# Patient Record
Sex: Male | Born: 1965 | Race: White | Hispanic: No | Marital: Married
Health system: Southern US, Community
[De-identification: ages and names within clinical notes are randomized; demographics above are authoritative.]

---

## 2016-01-17 ENCOUNTER — Emergency Department: Payer: Self-pay

## 2016-01-17 ENCOUNTER — Emergency Department
Admission: EM | Admit: 2016-01-17 | Discharge: 2016-01-17 | Disposition: A | Discharge status: Home / Self Care | Payer: Self-pay | Attending: Emergency Medicine | Admitting: Emergency Medicine

## 2016-01-17 ENCOUNTER — Encounter: Payer: Self-pay | Admitting: Emergency Medicine

## 2016-01-17 DIAGNOSIS — K824 Cholesterolosis of gallbladder: Secondary | ICD-10-CM | POA: Insufficient documentation

## 2016-01-17 DIAGNOSIS — K279 Peptic ulcer, site unspecified, unspecified as acute or chronic, without hemorrhage or perforation: Secondary | ICD-10-CM | POA: Insufficient documentation

## 2016-01-17 DIAGNOSIS — R109 Unspecified abdominal pain: Secondary | ICD-10-CM

## 2016-01-17 LAB — TROPONIN I

## 2016-01-17 LAB — COMPREHENSIVE METABOLIC PANEL
ALK PHOS: 104 U/L (ref 38–126)
ALT: 25 U/L (ref 17–63)
ANION GAP: 7 (ref 5–15)
AST: 28 U/L (ref 15–41)
Albumin: 4.4 g/dL (ref 3.5–5.0)
BILIRUBIN TOTAL: 0.8 mg/dL (ref 0.3–1.2)
BUN: 11 mg/dL (ref 6–20)
CALCIUM: 9.2 mg/dL (ref 8.9–10.3)
CO2: 25 mmol/L (ref 22–32)
Chloride: 104 mmol/L (ref 101–111)
Creatinine, Ser: 0.87 mg/dL (ref 0.61–1.24)
Glucose, Bld: 107 mg/dL — ABNORMAL HIGH (ref 65–99)
Potassium: 4.1 mmol/L (ref 3.5–5.1)
SODIUM: 136 mmol/L (ref 135–145)
TOTAL PROTEIN: 6.9 g/dL (ref 6.5–8.1)

## 2016-01-17 LAB — CBC
HCT: 45.5 % (ref 40.0–52.0)
Hemoglobin: 15.6 g/dL (ref 13.0–18.0)
MCH: 31.4 pg (ref 26.0–34.0)
MCHC: 34.2 g/dL (ref 32.0–36.0)
MCV: 91.9 fL (ref 80.0–100.0)
PLATELETS: 230 10*3/uL (ref 150–440)
RBC: 4.95 MIL/uL (ref 4.40–5.90)
RDW: 13.5 % (ref 11.5–14.5)
WBC: 6.3 10*3/uL (ref 3.8–10.6)

## 2016-01-17 LAB — LIPASE, BLOOD: Lipase: 47 U/L (ref 11–51)

## 2016-01-17 MED ORDER — GI COCKTAIL ~~LOC~~
30.0000 mL | Freq: Once | ORAL | Status: AC
  Administered 2016-01-17: 30 mL via ORAL
  Filled 2016-01-17: qty 30

## 2016-01-17 MED ORDER — SUCRALFATE 1 G PO TABS: 1.0000 g | ORAL_TABLET | Freq: Three times a day (TID) | ORAL | Status: AC

## 2016-01-17 NOTE — ED Notes (Signed)
Reviewed d/c instructions, follow-up care, diet, and prescriptions. Pt verbalized understanding

## 2016-01-17 NOTE — Discharge Instructions (Signed)

## 2016-01-17 NOTE — ED Notes (Signed)
Epigastric pain worsening when patient: "ate a taco salad"

## 2016-01-17 NOTE — ED Provider Notes (Addendum)
[facility] Emergency Department Provider Note  ____________________________________________   I have reviewed the triage vital signs and the nursing notes.   HISTORY  Chief Complaint Abdominal Pain    HPI Frank Michael is a 50 y.o. male with a history of acid reflux and states for months he has been having epigastric abdominal pain.Longer than that actually he states but usually was well-controlled when he took antacids. However he stopped taking antacids about a month ago and his pain is gotten worse. He finally had some very spicy food on Wednesday and that seemed to "kick things off" and since that time he said off and on epigastric abdominal pain which is worse with food. No radiation. No melena bright red blood per rectum. Patient smokes but does not use alcohol. Denies any nausea or vomiting or fever or chills or any other associated symptoms. Nothing makes the pain better except for avoiding spicy food nothing makes it worse aside from spicy food. He states he did have some minimal relief with his Nexium which he finally started taking again yesterday. No chest pain or shortness of breath no PE symptoms no DVT symptoms  History reviewed. No pertinent past medical history.  There are no active problems to display for this patient.   History reviewed. No pertinent past surgical history.  No current outpatient prescriptions on file.  Allergies Review of patient's allergies indicates no known allergies.  History reviewed. No pertinent family history.  Social History Social History  Substance Use Topics  . Smoking status: Unknown If Ever Smoked  . Smokeless tobacco: None  . Alcohol Use: None    Review of Systems Constitutional: No fever/chills Eyes: No visual changes. ENT: No sore throat. No stiff neck no neck pain Cardiovascular: Denies chest pain. Respiratory: Denies shortness of breath. Gastrointestinal:   no vomiting.  No diarrhea.  No  constipation. Genitourinary: Negative for dysuria. Musculoskeletal: Negative lower extremity swelling Skin: Negative for rash. Neurological: Negative for headaches, focal weakness or numbness. 10-point ROS otherwise negative.  ____________________________________________   PHYSICAL EXAM:  VITAL SIGNS: ED Triage Vitals  Enc Vitals Group     BP 01/17/16 1138 117/70 mmHg     Pulse Rate 01/17/16 1138 74     Resp 01/17/16 1138 18     Temp 01/17/16 1138 98.4 F (36.9 C)     Temp Source 01/17/16 1138 Oral     SpO2 01/17/16 1138 98 %     Weight 01/17/16 1138 130 lb (58.968 kg)     Height 01/17/16 1138 5\' 6"  (1.676 m)     Head Cir --      Peak Flow --      Pain Score 01/17/16 1138 5     Pain Loc --      Pain Edu? --      Excl. in GC? --     Constitutional: Alert and oriented. Well appearing and in no acute distress. Eyes: Conjunctivae are normal. PERRL. EOMI. Head: Atraumatic. Nose: No congestion/rhinnorhea. Mouth/Throat: Mucous membranes are moist.  Oropharynx non-erythematous. Neck: No stridor.   Nontender with no meningismus Cardiovascular: Normal rate, regular rhythm. Grossly normal heart sounds.  Good peripheral circulation. Respiratory: Normal respiratory effort.  No retractions. Lungs CTAB. Abdominal: Soft and Minimally tender to palpation in the epigastric region. No distention. No guarding no rebound Back:  There is no focal tenderness or step off there is no midline tenderness there are no lesions noted. there is no CVA tenderness Musculoskeletal: No lower  extremity tenderness. No joint effusions, no DVT signs strong distal pulses no edema Neurologic:  Normal speech and language. No gross focal neurologic deficits are appreciated.  Skin:  Skin is warm, dry and intact. No rash noted. Psychiatric: Mood and affect are normal. Speech and behavior are normal.  ____________________________________________   LABS (all labs ordered are listed, but only abnormal results are  displayed)  Labs Reviewed  COMPREHENSIVE METABOLIC PANEL - Abnormal; Notable for the following:    Glucose, Bld 107 (*)    All other components within normal limits  LIPASE, BLOOD  CBC  TROPONIN I   ____________________________________________  EKG  I personally interpreted any EKGs ordered by me or triage Normal sinus rhythm rate 64 bpm, RSR prime configuration, no acute ST elevation or depression, likely repolarization abnormality noted. ____________________________________________  RADIOLOGY  I reviewed any imaging ordered by me or triage that were performed during my shift and, if possible, patient and/or family made aware of any abnormal findings. ____________________________________________   PROCEDURES  Procedure(s) performed: None  Critical Care performed: None  ____________________________________________   INITIAL IMPRESSION / ASSESSMENT AND PLAN / ED COURSE  Pertinent labs & imaging results that were available during my care of the patient were reviewed by me and considered in my medical decision making (see chart for details).  Patient has reproducible food related epigastric abdominal pain which is better with antacids. I do give him a GI cocktail and he is pain free. Most of his pain is in the epigastric region. We'll obtain an ultrasound rule out gallbladder disease although have low suspicion.At this time, there does not appear to be clinical evidence to support the diagnosis of pulmonary embolus, dissection, myocarditis, endocarditis, pericarditis, pericardial tamponade, acute coronary syndrome, pneumothorax, pneumonia, or any other acute intrathoracic pathology that will require admission or acute intervention. Nor is there evidence of any surgical intra-abdominal pathology causing this discomfort.  ----------------------------------------- 4:13 PM on 01/17/2016 -----------------------------------------  Pt with no abd pain or tenderness at this time.  Since gi cocktail he has been pain free. Made him aware of findings of polyps on his gallbladder and the need to follow-up for that in the next few weeks, we'll also refer him to GI medicine. Started him on some Carafate, return precautions and follow-up given and understood. Counseled him not to smoke. ____________________________________________   FINAL CLINICAL IMPRESSION(S) / ED DIAGNOSES  Final diagnoses:  Abdominal pain      This chart was dictated using voice recognition software.  Despite best efforts to proofread,  errors can occur which can change meaning.     Jeanmarie Plant, MD 01/17/16 1527  Jeanmarie Plant, MD 01/17/16 1614  Jeanmarie Plant, MD 01/17/16 [phone]

## 2016-01-17 NOTE — ED Notes (Signed)
Pt states that since Wednesday he has been having epigastric pain , pt reports taking nexium in the past for similar pain but states it doesn't seem to help now, pt states it is affected by what he eats, pt reports nausea as well as decreased energy, [facility] but left prior to being seen

## 2017-08-19 IMAGING — US US ABDOMEN LIMITED
1 series · 14 of 25 positions shown · non-contrast
Comparison: None.

CLINICAL DATA: 50-year-old male with abdominal pain and nausea for
2 days.

EXAM:
US ABDOMEN LIMITED - RIGHT UPPER QUADRANT

[Series 1: us abdomen limited · 0.13mm/px · 14 of 73 slices shown]
[im 1/73]
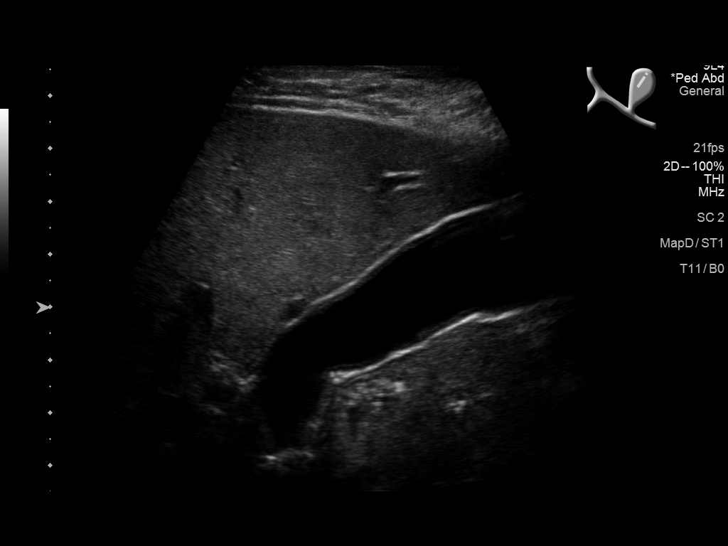
[im 7/73]
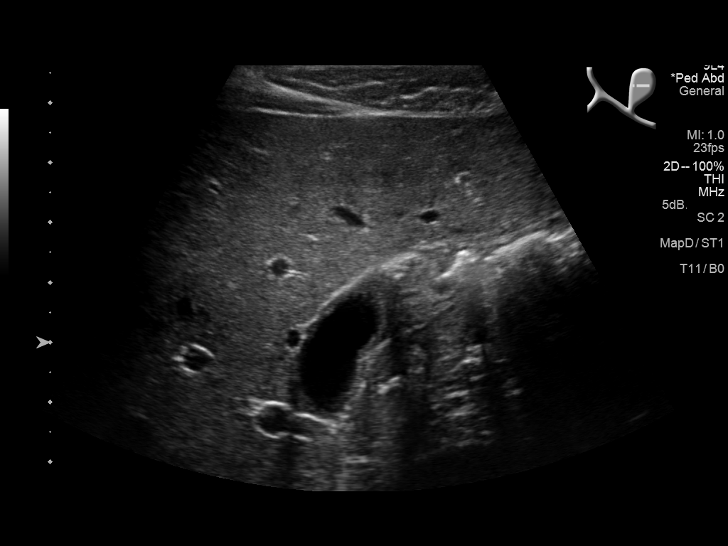
[im 13/73]
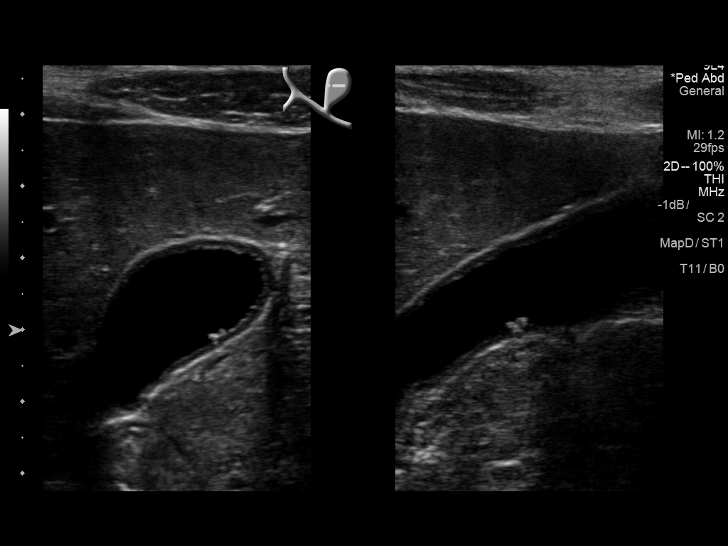
[im 19/73]
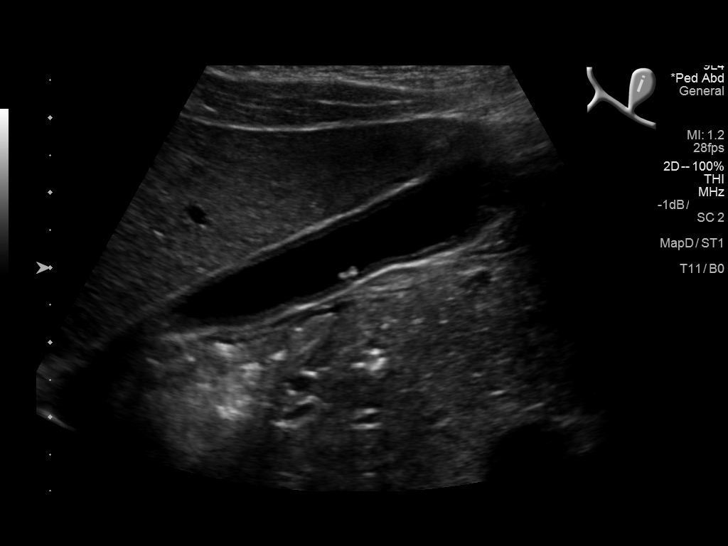
[im 25/73]
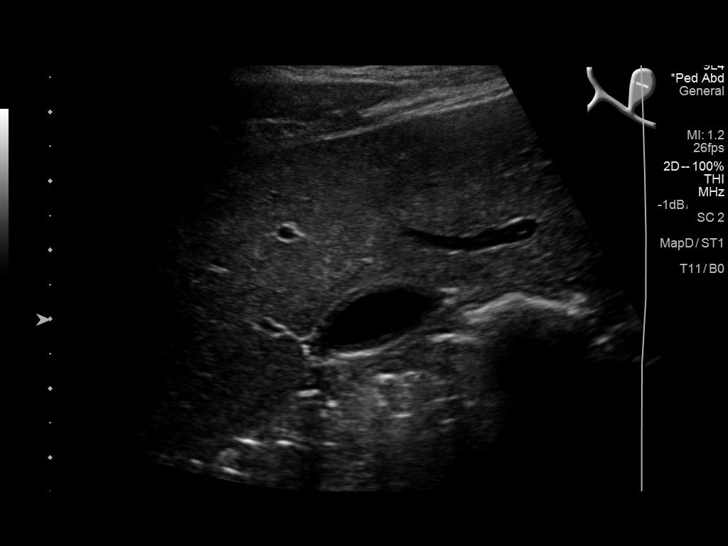
[im 28/73]
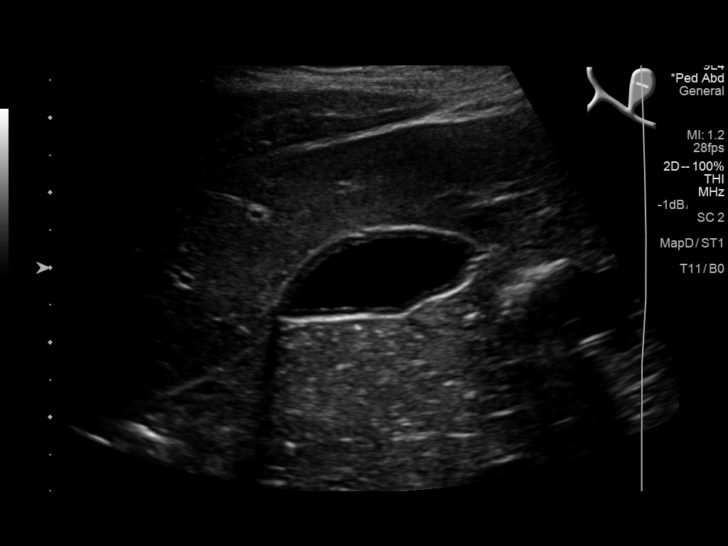
[im 34/73]
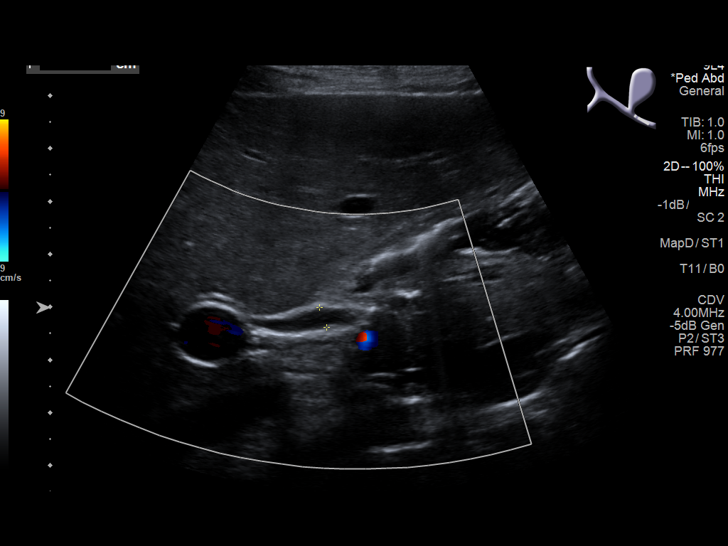
[im 40/73]
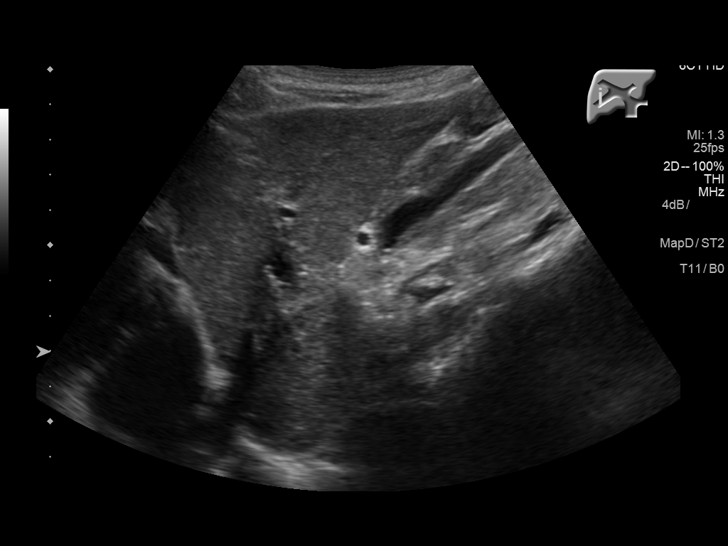
[im 46/73]
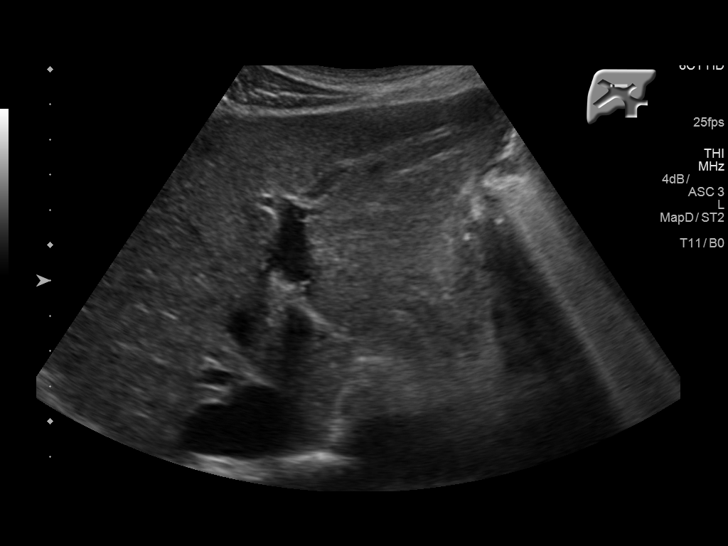
[im 49/73]
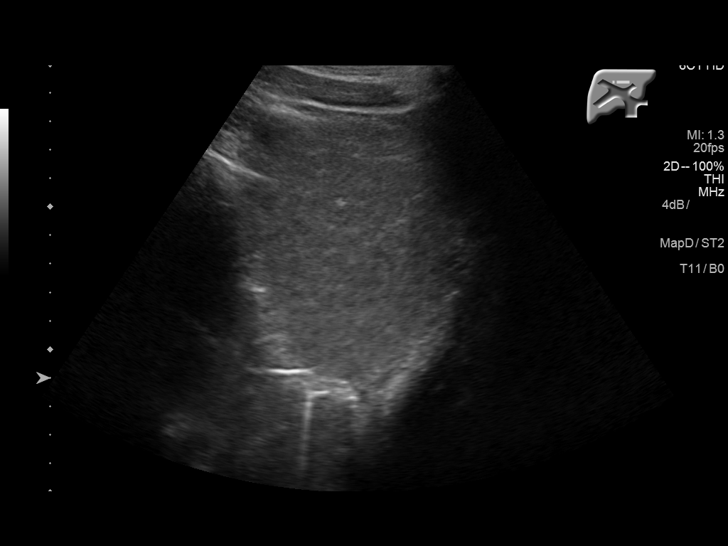
[im 55/73]
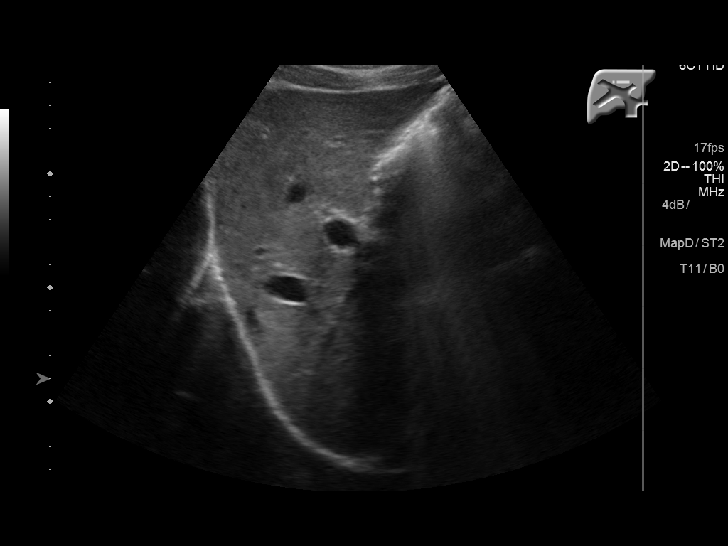
[im 61/73]
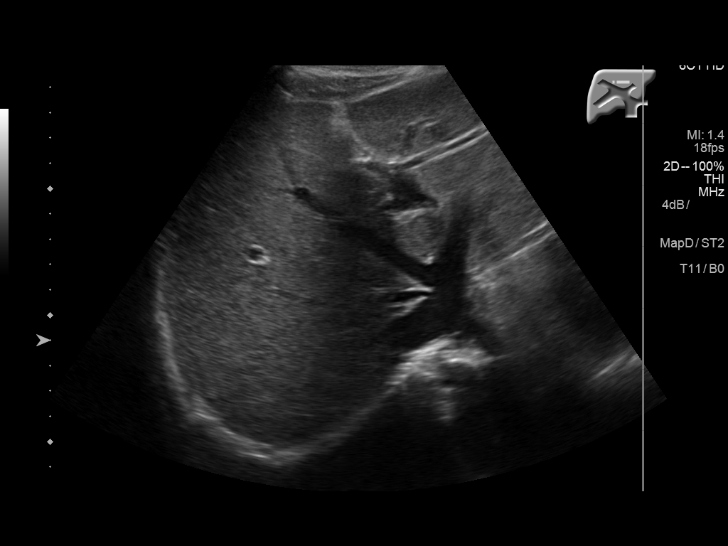
[im 67/73]
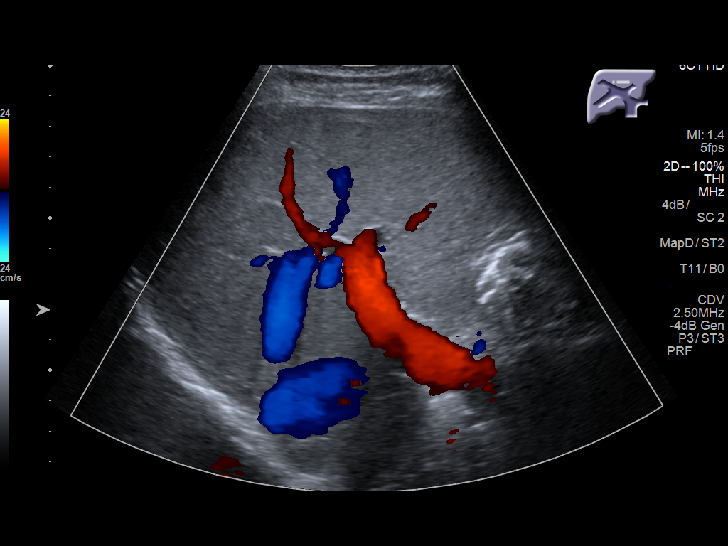
[im 73/73]
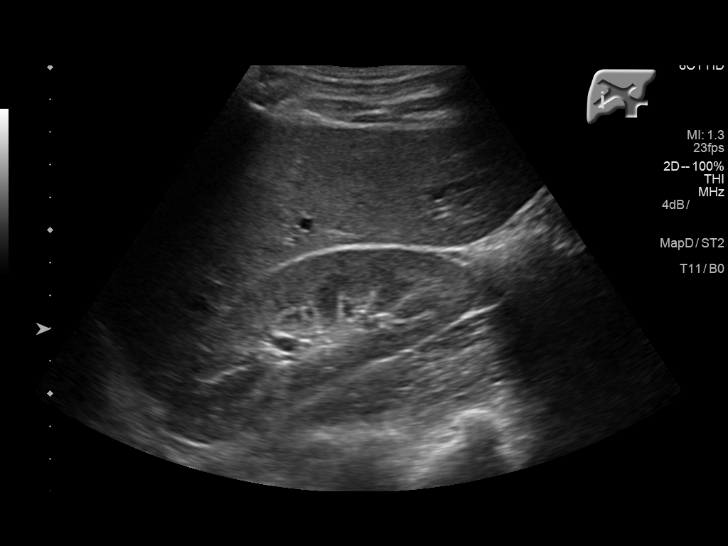

[14 of 25 positions shown; findings below may reference images not displayed]

FINDINGS: Gallbladder:

A 3 mm gallbladder polyp is noted. There is no evidence of
cholelithiasis or acute cholecystitis.

Common bile duct:

Diameter: 4 mm. There is no evidence of intrahepatic or extrahepatic
biliary dilatation.

Liver:

No focal lesion identified. Within normal limits in parenchymal
echogenicity.
IMPRESSION: 3 mm gallbladder polyp without other significant abnormality. No
evidence of cholelithiasis or acute cholecystitis.
